# Patient Record
Sex: Female | Born: 1950 | Race: White | Hispanic: No | Marital: Married
Health system: Southern US, Community
[De-identification: ages and names within clinical notes are randomized; demographics above are authoritative.]

---

## 1998-03-12 ENCOUNTER — Other Ambulatory Visit: Admission: RE | Admit: 1998-03-12 | Discharge: 1998-03-12 | Payer: Self-pay | Admitting: Obstetrics and Gynecology

## 1999-04-02 ENCOUNTER — Other Ambulatory Visit: Admission: RE | Admit: 1999-04-02 | Discharge: 1999-04-02 | Payer: Self-pay | Admitting: Obstetrics and Gynecology

## 1999-04-07 ENCOUNTER — Encounter: Admission: RE | Admit: 1999-04-07 | Discharge: 1999-04-07 | Payer: Self-pay | Admitting: *Deleted

## 2000-06-19 ENCOUNTER — Ambulatory Visit (HOSPITAL_COMMUNITY): Admission: RE | Admit: 2000-06-19 | Discharge: 2000-06-19 | Payer: Self-pay | Admitting: Internal Medicine

## 2000-06-19 ENCOUNTER — Encounter: Payer: Self-pay | Admitting: Internal Medicine

## 2000-12-17 ENCOUNTER — Other Ambulatory Visit: Admission: RE | Admit: 2000-12-17 | Discharge: 2000-12-17 | Payer: Self-pay | Admitting: Obstetrics and Gynecology

## 2000-12-20 ENCOUNTER — Encounter: Admission: RE | Admit: 2000-12-20 | Discharge: 2000-12-20 | Payer: Self-pay | Admitting: Obstetrics and Gynecology

## 2000-12-20 ENCOUNTER — Encounter: Payer: Self-pay | Admitting: Obstetrics and Gynecology

## 2002-01-31 ENCOUNTER — Encounter: Admission: RE | Admit: 2002-01-31 | Discharge: 2002-01-31 | Payer: Self-pay | Admitting: Obstetrics and Gynecology

## 2002-01-31 ENCOUNTER — Encounter: Payer: Self-pay | Admitting: Obstetrics and Gynecology

## 2002-03-27 ENCOUNTER — Other Ambulatory Visit: Admission: RE | Admit: 2002-03-27 | Discharge: 2002-03-27 | Payer: Self-pay | Admitting: Obstetrics and Gynecology

## 2003-03-14 ENCOUNTER — Encounter: Admission: RE | Admit: 2003-03-14 | Discharge: 2003-03-14 | Payer: Self-pay | Admitting: Obstetrics and Gynecology

## 2003-03-29 ENCOUNTER — Other Ambulatory Visit: Admission: RE | Admit: 2003-03-29 | Discharge: 2003-03-29 | Payer: Self-pay | Admitting: Obstetrics and Gynecology

## 2004-03-18 ENCOUNTER — Encounter: Admission: RE | Admit: 2004-03-18 | Discharge: 2004-03-18 | Payer: Self-pay | Admitting: Obstetrics and Gynecology

## 2005-03-20 ENCOUNTER — Encounter: Admission: RE | Admit: 2005-03-20 | Discharge: 2005-03-20 | Payer: Self-pay | Admitting: Internal Medicine

## 2006-03-24 ENCOUNTER — Encounter: Admission: RE | Admit: 2006-03-24 | Discharge: 2006-03-24 | Payer: Self-pay | Admitting: Obstetrics and Gynecology

## 2007-04-12 ENCOUNTER — Encounter: Admission: RE | Admit: 2007-04-12 | Discharge: 2007-04-12 | Payer: Self-pay | Admitting: Obstetrics and Gynecology

## 2008-04-12 ENCOUNTER — Encounter: Admission: RE | Admit: 2008-04-12 | Discharge: 2008-04-12 | Payer: Self-pay | Admitting: Internal Medicine

## 2009-04-15 ENCOUNTER — Encounter: Admission: RE | Admit: 2009-04-15 | Discharge: 2009-04-15 | Payer: Self-pay | Admitting: Obstetrics and Gynecology

## 2009-09-24 ENCOUNTER — Encounter: Admission: RE | Admit: 2009-09-24 | Discharge: 2009-09-24 | Payer: Self-pay | Admitting: Obstetrics and Gynecology

## 2010-03-20 ENCOUNTER — Other Ambulatory Visit: Payer: Self-pay | Admitting: Internal Medicine

## 2010-03-20 DIAGNOSIS — Z1231 Encounter for screening mammogram for malignant neoplasm of breast: Secondary | ICD-10-CM

## 2010-04-22 ENCOUNTER — Ambulatory Visit
Admission: RE | Admit: 2010-04-22 | Discharge: 2010-04-22 | Disposition: A | Discharge status: Home / Self Care | Payer: 59 | Source: Ambulatory Visit | Attending: Internal Medicine | Admitting: Internal Medicine

## 2010-04-22 DIAGNOSIS — Z1231 Encounter for screening mammogram for malignant neoplasm of breast: Secondary | ICD-10-CM

## 2011-03-17 ENCOUNTER — Other Ambulatory Visit: Payer: Self-pay | Admitting: Internal Medicine

## 2011-03-17 DIAGNOSIS — Z1231 Encounter for screening mammogram for malignant neoplasm of breast: Secondary | ICD-10-CM

## 2011-04-24 ENCOUNTER — Ambulatory Visit
Admission: RE | Admit: 2011-04-24 | Discharge: 2011-04-24 | Disposition: A | Discharge status: Home / Self Care | Payer: 59 | Source: Ambulatory Visit | Attending: Internal Medicine | Admitting: Internal Medicine

## 2011-04-24 DIAGNOSIS — Z1231 Encounter for screening mammogram for malignant neoplasm of breast: Secondary | ICD-10-CM

## 2012-04-15 ENCOUNTER — Other Ambulatory Visit: Payer: Self-pay

## 2012-04-15 DIAGNOSIS — Z1231 Encounter for screening mammogram for malignant neoplasm of breast: Secondary | ICD-10-CM

## 2012-04-29 ENCOUNTER — Ambulatory Visit
Admission: RE | Admit: 2012-04-29 | Discharge: 2012-04-29 | Disposition: A | Discharge status: Home / Self Care | Payer: 59 | Source: Ambulatory Visit

## 2012-04-29 DIAGNOSIS — Z1231 Encounter for screening mammogram for malignant neoplasm of breast: Secondary | ICD-10-CM

## 2012-10-25 ENCOUNTER — Other Ambulatory Visit: Payer: Self-pay | Admitting: Obstetrics and Gynecology

## 2012-10-25 DIAGNOSIS — M8580 Other specified disorders of bone density and structure, unspecified site: Secondary | ICD-10-CM

## 2012-10-31 ENCOUNTER — Ambulatory Visit
Admission: RE | Admit: 2012-10-31 | Discharge: 2012-10-31 | Disposition: A | Discharge status: Home / Self Care | Payer: 59 | Source: Ambulatory Visit | Attending: Obstetrics and Gynecology | Admitting: Obstetrics and Gynecology

## 2012-10-31 DIAGNOSIS — M8580 Other specified disorders of bone density and structure, unspecified site: Secondary | ICD-10-CM

## 2013-04-25 ENCOUNTER — Other Ambulatory Visit: Payer: Self-pay

## 2013-04-25 DIAGNOSIS — Z1231 Encounter for screening mammogram for malignant neoplasm of breast: Secondary | ICD-10-CM

## 2013-05-17 ENCOUNTER — Ambulatory Visit
Admission: RE | Admit: 2013-05-17 | Discharge: 2013-05-17 | Disposition: A | Discharge status: Home / Self Care | Payer: 59 | Source: Ambulatory Visit

## 2013-05-17 DIAGNOSIS — Z1231 Encounter for screening mammogram for malignant neoplasm of breast: Secondary | ICD-10-CM

## 2014-02-06 DIAGNOSIS — H6503 Acute serous otitis media, bilateral: Secondary | ICD-10-CM | POA: Diagnosis not present

## 2014-02-06 DIAGNOSIS — J209 Acute bronchitis, unspecified: Secondary | ICD-10-CM | POA: Diagnosis not present

## 2014-02-19 DIAGNOSIS — E780 Pure hypercholesterolemia: Secondary | ICD-10-CM | POA: Diagnosis not present

## 2014-02-19 DIAGNOSIS — R1032 Left lower quadrant pain: Secondary | ICD-10-CM | POA: Diagnosis not present

## 2014-02-19 DIAGNOSIS — H9113 Presbycusis, bilateral: Secondary | ICD-10-CM | POA: Diagnosis not present

## 2014-02-19 DIAGNOSIS — I70219 Atherosclerosis of native arteries of extremities with intermittent claudication, unspecified extremity: Secondary | ICD-10-CM | POA: Diagnosis not present

## 2014-02-19 DIAGNOSIS — Z79899 Other long term (current) drug therapy: Secondary | ICD-10-CM | POA: Diagnosis not present

## 2014-02-19 DIAGNOSIS — R5383 Other fatigue: Secondary | ICD-10-CM | POA: Diagnosis not present

## 2014-02-19 DIAGNOSIS — Z131 Encounter for screening for diabetes mellitus: Secondary | ICD-10-CM | POA: Diagnosis not present

## 2014-02-19 DIAGNOSIS — Z0000 Encounter for general adult medical examination without abnormal findings: Secondary | ICD-10-CM | POA: Diagnosis not present

## 2014-02-19 DIAGNOSIS — Z1389 Encounter for screening for other disorder: Secondary | ICD-10-CM | POA: Diagnosis not present

## 2014-02-19 DIAGNOSIS — R42 Dizziness and giddiness: Secondary | ICD-10-CM | POA: Diagnosis not present

## 2014-02-19 DIAGNOSIS — H8309 Labyrinthitis, unspecified ear: Secondary | ICD-10-CM | POA: Diagnosis not present

## 2014-04-24 ENCOUNTER — Other Ambulatory Visit: Payer: Self-pay

## 2014-04-24 DIAGNOSIS — Z1231 Encounter for screening mammogram for malignant neoplasm of breast: Secondary | ICD-10-CM

## 2014-05-21 ENCOUNTER — Ambulatory Visit
Admission: RE | Admit: 2014-05-21 | Discharge: 2014-05-21 | Disposition: A | Discharge status: Home / Self Care | Payer: Medicare Other | Source: Ambulatory Visit

## 2014-05-21 DIAGNOSIS — Z1231 Encounter for screening mammogram for malignant neoplasm of breast: Secondary | ICD-10-CM

## 2015-04-17 ENCOUNTER — Other Ambulatory Visit: Payer: Self-pay

## 2015-04-17 DIAGNOSIS — Z1231 Encounter for screening mammogram for malignant neoplasm of breast: Secondary | ICD-10-CM

## 2015-05-22 ENCOUNTER — Ambulatory Visit
Admission: RE | Admit: 2015-05-22 | Discharge: 2015-05-22 | Disposition: A | Discharge status: Home / Self Care | Payer: Medicare Other | Source: Ambulatory Visit

## 2015-05-22 DIAGNOSIS — Z1231 Encounter for screening mammogram for malignant neoplasm of breast: Secondary | ICD-10-CM

## 2015-10-11 ENCOUNTER — Other Ambulatory Visit: Payer: Self-pay | Admitting: Obstetrics and Gynecology

## 2015-10-11 DIAGNOSIS — M8580 Other specified disorders of bone density and structure, unspecified site: Secondary | ICD-10-CM

## 2015-10-18 ENCOUNTER — Ambulatory Visit
Admission: RE | Admit: 2015-10-18 | Discharge: 2015-10-18 | Disposition: A | Discharge status: Home / Self Care | Payer: Medicare Other | Source: Ambulatory Visit | Attending: Obstetrics and Gynecology | Admitting: Obstetrics and Gynecology

## 2015-10-18 DIAGNOSIS — M8580 Other specified disorders of bone density and structure, unspecified site: Secondary | ICD-10-CM

## 2016-05-05 ENCOUNTER — Other Ambulatory Visit: Payer: Self-pay | Admitting: Internal Medicine

## 2016-05-05 DIAGNOSIS — Z1231 Encounter for screening mammogram for malignant neoplasm of breast: Secondary | ICD-10-CM

## 2016-05-25 ENCOUNTER — Ambulatory Visit
Admission: RE | Admit: 2016-05-25 | Discharge: 2016-05-25 | Disposition: A | Discharge status: Home / Self Care | Payer: Medicare Other | Source: Ambulatory Visit | Attending: Internal Medicine | Admitting: Internal Medicine

## 2016-05-25 DIAGNOSIS — Z1231 Encounter for screening mammogram for malignant neoplasm of breast: Secondary | ICD-10-CM

## 2017-05-04 ENCOUNTER — Other Ambulatory Visit: Payer: Self-pay | Admitting: Internal Medicine

## 2017-05-04 DIAGNOSIS — Z1231 Encounter for screening mammogram for malignant neoplasm of breast: Secondary | ICD-10-CM

## 2017-05-26 ENCOUNTER — Ambulatory Visit
Admission: RE | Admit: 2017-05-26 | Discharge: 2017-05-26 | Disposition: A | Discharge status: Home / Self Care | Payer: Medicare Other | Source: Ambulatory Visit | Attending: Internal Medicine | Admitting: Internal Medicine

## 2017-05-26 DIAGNOSIS — Z1231 Encounter for screening mammogram for malignant neoplasm of breast: Secondary | ICD-10-CM

## 2018-02-16 ENCOUNTER — Other Ambulatory Visit: Payer: Self-pay | Admitting: Obstetrics and Gynecology

## 2018-02-16 DIAGNOSIS — M8580 Other specified disorders of bone density and structure, unspecified site: Secondary | ICD-10-CM

## 2018-02-16 DIAGNOSIS — Z1231 Encounter for screening mammogram for malignant neoplasm of breast: Secondary | ICD-10-CM

## 2018-05-31 ENCOUNTER — Ambulatory Visit: Payer: Self-pay

## 2018-05-31 ENCOUNTER — Other Ambulatory Visit: Payer: Self-pay

## 2018-07-25 ENCOUNTER — Ambulatory Visit
Admission: RE | Admit: 2018-07-25 | Discharge: 2018-07-25 | Disposition: A | Discharge status: Home / Self Care | Payer: Medicare Other | Source: Ambulatory Visit | Attending: Obstetrics and Gynecology | Admitting: Obstetrics and Gynecology

## 2018-07-25 ENCOUNTER — Other Ambulatory Visit: Payer: Self-pay

## 2018-07-25 DIAGNOSIS — Z1231 Encounter for screening mammogram for malignant neoplasm of breast: Secondary | ICD-10-CM

## 2018-07-25 DIAGNOSIS — M8580 Other specified disorders of bone density and structure, unspecified site: Secondary | ICD-10-CM

## 2019-01-24 ENCOUNTER — Emergency Department (HOSPITAL_COMMUNITY): Payer: Medicare Other

## 2019-01-24 ENCOUNTER — Emergency Department (HOSPITAL_COMMUNITY)
Admission: EM | Admit: 2019-01-24 | Discharge: 2019-01-24 | Disposition: A | Discharge status: Home / Self Care | Payer: Medicare Other | Attending: Emergency Medicine | Admitting: Emergency Medicine

## 2019-01-24 DIAGNOSIS — Y999 Unspecified external cause status: Secondary | ICD-10-CM | POA: Insufficient documentation

## 2019-01-24 DIAGNOSIS — S43014A Anterior dislocation of right humerus, initial encounter: Secondary | ICD-10-CM | POA: Diagnosis not present

## 2019-01-24 DIAGNOSIS — W010XXA Fall on same level from slipping, tripping and stumbling without subsequent striking against object, initial encounter: Secondary | ICD-10-CM | POA: Insufficient documentation

## 2019-01-24 DIAGNOSIS — S4991XA Unspecified injury of right shoulder and upper arm, initial encounter: Secondary | ICD-10-CM | POA: Diagnosis present

## 2019-01-24 DIAGNOSIS — Y9389 Activity, other specified: Secondary | ICD-10-CM | POA: Insufficient documentation

## 2019-01-24 DIAGNOSIS — Y929 Unspecified place or not applicable: Secondary | ICD-10-CM | POA: Diagnosis not present

## 2019-01-24 MED ORDER — KETOROLAC TROMETHAMINE 30 MG/ML IJ SOLN
30.0000 mg | Freq: Once | INTRAMUSCULAR | Status: AC
  Administered 2019-01-24: 30 mg via INTRAVENOUS
  Filled 2019-01-24: qty 1

## 2019-01-24 MED ORDER — HYDROMORPHONE HCL 1 MG/ML IJ SOLN
1.0000 mg | Freq: Once | INTRAMUSCULAR | Status: AC
  Administered 2019-01-24: 1 mg via INTRAVENOUS
  Filled 2019-01-24: qty 1

## 2019-01-24 MED ORDER — HYDROCODONE-ACETAMINOPHEN 5-325 MG PO TABS: 1.0000 | ORAL_TABLET | Freq: Four times a day (QID) | ORAL | 0 refills | Status: AC | PRN

## 2019-01-24 MED ORDER — PROPOFOL 10 MG/ML IV BOLUS
0.5000 mg/kg | Freq: Once | INTRAVENOUS | Status: AC
  Administered 2019-01-24: 50 mg via INTRAVENOUS
  Filled 2019-01-24: qty 20

## 2019-01-24 MED ORDER — HYDROCODONE-ACETAMINOPHEN 5-325 MG PO TABS
1.0000 | ORAL_TABLET | Freq: Once | ORAL | Status: AC
  Administered 2019-01-24: 1 via ORAL
  Filled 2019-01-24: qty 1

## 2019-01-24 MED ORDER — PROPOFOL 10 MG/ML IV BOLUS
0.5000 mg/kg | Freq: Once | INTRAVENOUS | Status: AC
  Administered 2019-01-24: 20 mg via INTRAVENOUS

## 2019-01-24 MED ORDER — NAPROXEN 375 MG PO TABS: 375.0000 mg | ORAL_TABLET | Freq: Two times a day (BID) | ORAL | 0 refills | Status: AC

## 2019-01-24 NOTE — ED Notes (Signed)
Pt to xray at this time.

## 2019-01-24 NOTE — ED Provider Notes (Signed)
[facility]   CSN: [id number] Arrival date & time: 01/24/19  1434     History No chief complaint on file.   Lacey Jenkins is a 68 y.o. female.  HPI Patient was trying to help her husband as he was falling.  She recognize that she could not completely catch him but she was holding onto him with her left arm and trying to break the fall with her right.  She ended up landing on her outstretched right arm which slid in a very unusual and painful position.  She denies she sustained any other injury.  She reports that shoulder on the right is severely painful and she cannot move it.  She reports that she landed on top of him and did not hit her head or sustain any other injuries.    No past medical history on file.  There are no problems to display for this patient.   No past surgical history on file.   OB History   No obstetric history on file.     No family history on file.  Social History   Tobacco Use  . Smoking status: Not on file  Substance Use Topics  . Alcohol use: Not on file  . Drug use: Not on file    Home Medications Prior to Admission medications   Not on File    Allergies    Patient has no known allergies.  Review of Systems   Review of Systems 10 Systems reviewed and are negative for acute change except as noted in the HPI.  Physical Exam Updated Vital Signs BP 137/87 (BP Location: Right Arm)   Pulse 72   Temp 97.6 F (36.4 C) (Oral)   Resp 17   Ht 5\' 6"  (1.676 m)   Wt 73.9 kg   SpO2 93%   BMI 26.31 kg/m   Physical Exam Constitutional:      Comments: Patient is alert and nontoxic.  Mental status clear.  She is clearly in pain.  No respiratory distress.  HENT:     Head: Normocephalic and atraumatic.     Mouth/Throat:     Mouth: Mucous membranes are moist.     Pharynx: Oropharynx is clear.  Eyes:     Extraocular Movements: Extraocular movements intact.  Neck:     Comments: No  C-spine tenderness to palpation Cardiovascular:     Rate and Rhythm: Normal rate and regular rhythm.  Pulmonary:     Effort: Pulmonary effort is normal.     Breath sounds: Normal breath sounds.  Abdominal:     General: There is no distension.     Palpations: Abdomen is soft.     Tenderness: There is no abdominal tenderness. There is no guarding.  Musculoskeletal:     Cervical back: Neck supple.     Comments: Right shoulder has appearance of subluxation with deformity present.  Radial pulse was 1+ prereduction and strong and 2+ postreduction.  Prereduction, patient had difficulty completely opening her hand.  She had paresthesias and numbness which she reported was of the whole hand.  Skin:    General: Skin is warm and dry.  Neurological:     General: No focal deficit present.     Mental Status: She is oriented to person, place, and time.     Coordination: Coordination normal.  Psychiatric:        Mood and Affect: Mood normal.     ED Results / Procedures / Treatments  Labs (all labs ordered are listed, but only abnormal results are displayed) Labs Reviewed - No data to display  EKG None  Radiology No results found.  Procedures .Sedation  Date/Time: 01/24/2019 5:05 PM Performed by: Arby Barrette, MD Authorized by: Arby Barrette, MD   Consent:    Consent obtained:  Verbal   Consent given by:  Patient   Risks discussed:  Allergic reaction, dysrhythmia, inadequate sedation, nausea, prolonged hypoxia resulting in organ damage, prolonged sedation necessitating reversal, respiratory compromise necessitating ventilatory assistance and intubation and vomiting   Alternatives discussed:  Analgesia without sedation, anxiolysis and regional anesthesia Universal protocol:    Procedure explained and questions answered to patient or proxy's satisfaction: yes     Relevant documents present and verified: yes     Test results available and properly labeled: yes     Imaging studies  available: yes     Required blood products, implants, devices, and special equipment available: yes     Site/side marked: yes     Immediately prior to procedure a time out was called: yes     Patient identity confirmation method:  Verbally with patient Indications:    Procedure necessitating sedation performed by:  Physician performing sedation Pre-sedation assessment:    Time since last food or drink:  12   ASA classification: class 1 - normal, healthy patient     Neck mobility: normal     Mouth opening:  3 or more finger widths   Thyromental distance:  4 finger widths   Mallampati score:  I - soft palate, uvula, fauces, pillars visible   Pre-sedation assessments completed and reviewed: airway patency, cardiovascular function, hydration status, mental status, nausea/vomiting, pain level, respiratory function and temperature   Immediate pre-procedure details:    Reassessment: Patient reassessed immediately prior to procedure     Reviewed: vital signs, relevant labs/tests and NPO status     Verified: bag valve mask available, emergency equipment available, intubation equipment available, IV patency confirmed, oxygen available and suction available   Procedure details (see MAR for exact dosages):    Preoxygenation:  Nasal cannula   Sedation:  Propofol   Intended level of sedation: deep   Intra-procedure monitoring:  Blood pressure monitoring, cardiac monitor, continuous pulse oximetry, frequent LOC assessments, frequent vital sign checks and continuous capnometry   Intra-procedure events: none     Total Provider sedation time (minutes):  20 Post-procedure details:    Post-sedation assessment completed:  01/24/2019 5:06 PM   Attendance: Constant attendance by certified staff until patient recovered     Recovery: Patient returned to pre-procedure baseline     Post-sedation assessments completed and reviewed: airway patency, cardiovascular function, hydration status, mental status,  nausea/vomiting, pain level, respiratory function and temperature     Patient is stable for discharge or admission: yes     Patient tolerance:  Tolerated well, no immediate complications .Ortho Injury Treatment  Date/Time: 01/24/2019 5:06 PM Performed by: Arby Barrette, MD Authorized by: Arby Barrette, MD   Consent:    Consent obtained:  Verbal   Consent given by:  Patient   Risks discussed:  Fracture, nerve damage, recurrent dislocation, irreducible dislocation, stiffness, restricted joint movement and vascular damageInjury location: shoulder Location details: right shoulder Injury type: dislocation Dislocation type: anterior Hill-Sachs deformity: no Chronicity: new Pre-procedure distal perfusion: diminished Pre-procedure neurological function: diminished Pre-procedure range of motion: reduced  Anesthesia: Local anesthesia used: no  Patient sedated: Yes. Refer to sedation procedure documentation for details of sedation. Manipulation performed: yes  Reduction method: traction and counter traction Reduction successful: yes X-ray confirmed reduction: yes Immobilization: sling Post-procedure neurovascular assessment: post-procedure neurovascularly intact Post-procedure distal perfusion: normal Post-procedure neurological function: normal Post-procedure range of motion: improved Patient tolerance: patient tolerated the procedure well with no immediate complications    (including critical care time)  Medications Ordered in ED Medications - No data to display  ED Course  I have reviewed the triage vital signs and the nursing notes.  Pertinent labs & imaging results that were available during my care of the patient were reviewed by me and considered in my medical decision making (see chart for details).    MDM Rules/Calculators/A&P                      Patient presents with isolated shoulder dislocation.  This occurred during a fall as outlined above.  This was reduced  under sedation.  Prereduction patient did have diminished radial pulse and report of numbness and paresthesia of her entire hand.  Postreduction, radial pulses 2+ and strong, brisk cap refill of the hand and now normal opening and closing of the fingers.  She does continue to have pain with range of motion at the shoulder.  X-ray shows the shoulder to be reduced.  She may have also significant soft tissue injury of the shoulder.  Patient is counseled that she must follow-up with orthopedics.  Will be placed in a shoulder immobilizer with naproxen and Vicodin for pain control. Final Clinical Impression(s) / ED Diagnoses Final diagnoses:  None    Rx / DC Orders ED Discharge Orders    None       Charlesetta Shanks, MD 01/24/19 1709

## 2019-01-24 NOTE — ED Notes (Signed)
Doctor at bedside.

## 2019-01-24 NOTE — ED Notes (Signed)
Nurse Navigator called family and updated them on pt status.

## 2019-01-24 NOTE — Progress Notes (Signed)
Responded to Pt room for conscious sedation stand by.

## 2019-01-24 NOTE — ED Triage Notes (Addendum)
Pt here via EMS with co right shoulder pain. Her husband was passing out and she went to catch him a possibly dislocated her rt shoulder. EMS gave her 268mcg of fent.   Numbness to rt hand and diminished radial pulse on rt side.

## 2019-01-24 NOTE — Discharge Instructions (Signed)
1.  Wear your shoulder immobilizer until you are seen by orthopedics.  You may remove it to shower.  Do not raise your arm above your head. 2.  Take naproxen twice daily for pain control.  You may also take Vicodin 1 to 2 tablets every 6 hours if needed for additional pain control. 3.  Apply ice to the shoulder that is well wrapped for the next 48 hours for about 20 minutes every 2 hours.

## 2019-02-23 ENCOUNTER — Ambulatory Visit: Payer: Medicare Other

## 2019-03-04 ENCOUNTER — Ambulatory Visit: Payer: Medicare Other | Attending: Internal Medicine

## 2019-03-04 DIAGNOSIS — Z23 Encounter for immunization: Secondary | ICD-10-CM | POA: Insufficient documentation

## 2019-03-04 NOTE — Progress Notes (Signed)
   Covid-19 Vaccination Clinic  Name:  Lacey Jenkins    MRN: [id number] DOB: 1950-12-29  03/04/2019  [name] was observed post Covid-19 immunization for 15 minutes without incidence. She was provided with Vaccine Information Sheet and instruction to access the V-Safe system.   [name] was instructed to call 911 with any severe reactions post vaccine: Marland Kitchen Difficulty breathing  . Swelling of your face and throat  . A fast heartbeat  . A bad rash all over your body  . Dizziness and weakness    Immunizations Administered    Name Date Dose VIS Date Route   Pfizer COVID-19 Vaccine 03/04/2019 10:13 AM 0.3 mL 01/06/2019 Intramuscular   Manufacturer: [employer], [employer]   Lot: [id number]   NDC: [address]-3

## 2019-03-29 ENCOUNTER — Ambulatory Visit: Payer: Medicare Other | Attending: Internal Medicine

## 2019-03-29 DIAGNOSIS — Z23 Encounter for immunization: Secondary | ICD-10-CM | POA: Insufficient documentation

## 2019-03-29 NOTE — Progress Notes (Signed)
   Covid-19 Vaccination Clinic  Name:  Lacey Jenkins    MRN: [id number] DOB: Jul 21, 1950  03/29/2019  [name] was observed post Covid-19 immunization for 15 minutes without incident. She was provided with Vaccine Information Sheet and instruction to access the V-Safe system.   [name] was instructed to call 911 with any severe reactions post vaccine: Marland Kitchen Difficulty breathing  . Swelling of face and throat  . A fast heartbeat  . A bad rash all over body  . Dizziness and weakness   Immunizations Administered    Name Date Dose VIS Date Route   Pfizer COVID-19 Vaccine 03/29/2019  8:35 AM 0.3 mL 01/06/2019 Intramuscular   Manufacturer: [employer], [employer]   Lot: [id number]   NDC: [address]-2

## 2019-06-28 ENCOUNTER — Other Ambulatory Visit: Payer: Self-pay | Admitting: Internal Medicine

## 2019-06-28 DIAGNOSIS — Z1231 Encounter for screening mammogram for malignant neoplasm of breast: Secondary | ICD-10-CM

## 2019-07-27 ENCOUNTER — Ambulatory Visit
Admission: RE | Admit: 2019-07-27 | Discharge: 2019-07-27 | Disposition: A | Discharge status: Home / Self Care | Payer: Medicare Other | Source: Ambulatory Visit | Attending: Internal Medicine | Admitting: Internal Medicine

## 2019-07-27 ENCOUNTER — Other Ambulatory Visit: Payer: Self-pay

## 2019-07-27 DIAGNOSIS — Z1231 Encounter for screening mammogram for malignant neoplasm of breast: Secondary | ICD-10-CM

## 2020-07-08 ENCOUNTER — Other Ambulatory Visit: Payer: Self-pay | Admitting: Internal Medicine

## 2020-07-08 DIAGNOSIS — Z1231 Encounter for screening mammogram for malignant neoplasm of breast: Secondary | ICD-10-CM

## 2020-08-29 ENCOUNTER — Inpatient Hospital Stay: Admission: RE | Admit: 2020-08-29 | Payer: Medicare Other | Source: Ambulatory Visit

## 2020-10-18 ENCOUNTER — Ambulatory Visit
Admission: RE | Admit: 2020-10-18 | Discharge: 2020-10-18 | Disposition: A | Discharge status: Home / Self Care | Payer: Medicare Other | Source: Ambulatory Visit | Attending: Internal Medicine | Admitting: Internal Medicine

## 2020-10-18 ENCOUNTER — Other Ambulatory Visit: Payer: Self-pay

## 2020-10-18 DIAGNOSIS — Z1231 Encounter for screening mammogram for malignant neoplasm of breast: Secondary | ICD-10-CM

## 2020-12-13 IMAGING — DX DG SHOULDER 2+V PORT*R*
1 series · 2 of 2 positions shown · non-contrast
Comparison: January 24, 2019

CLINICAL DATA: Post reduction radiographs

EXAM:
PORTABLE RIGHT SHOULDER

[Series 1: shoulder · 0.14mm/px · 2 of 2 slices shown]
[im 1/2]
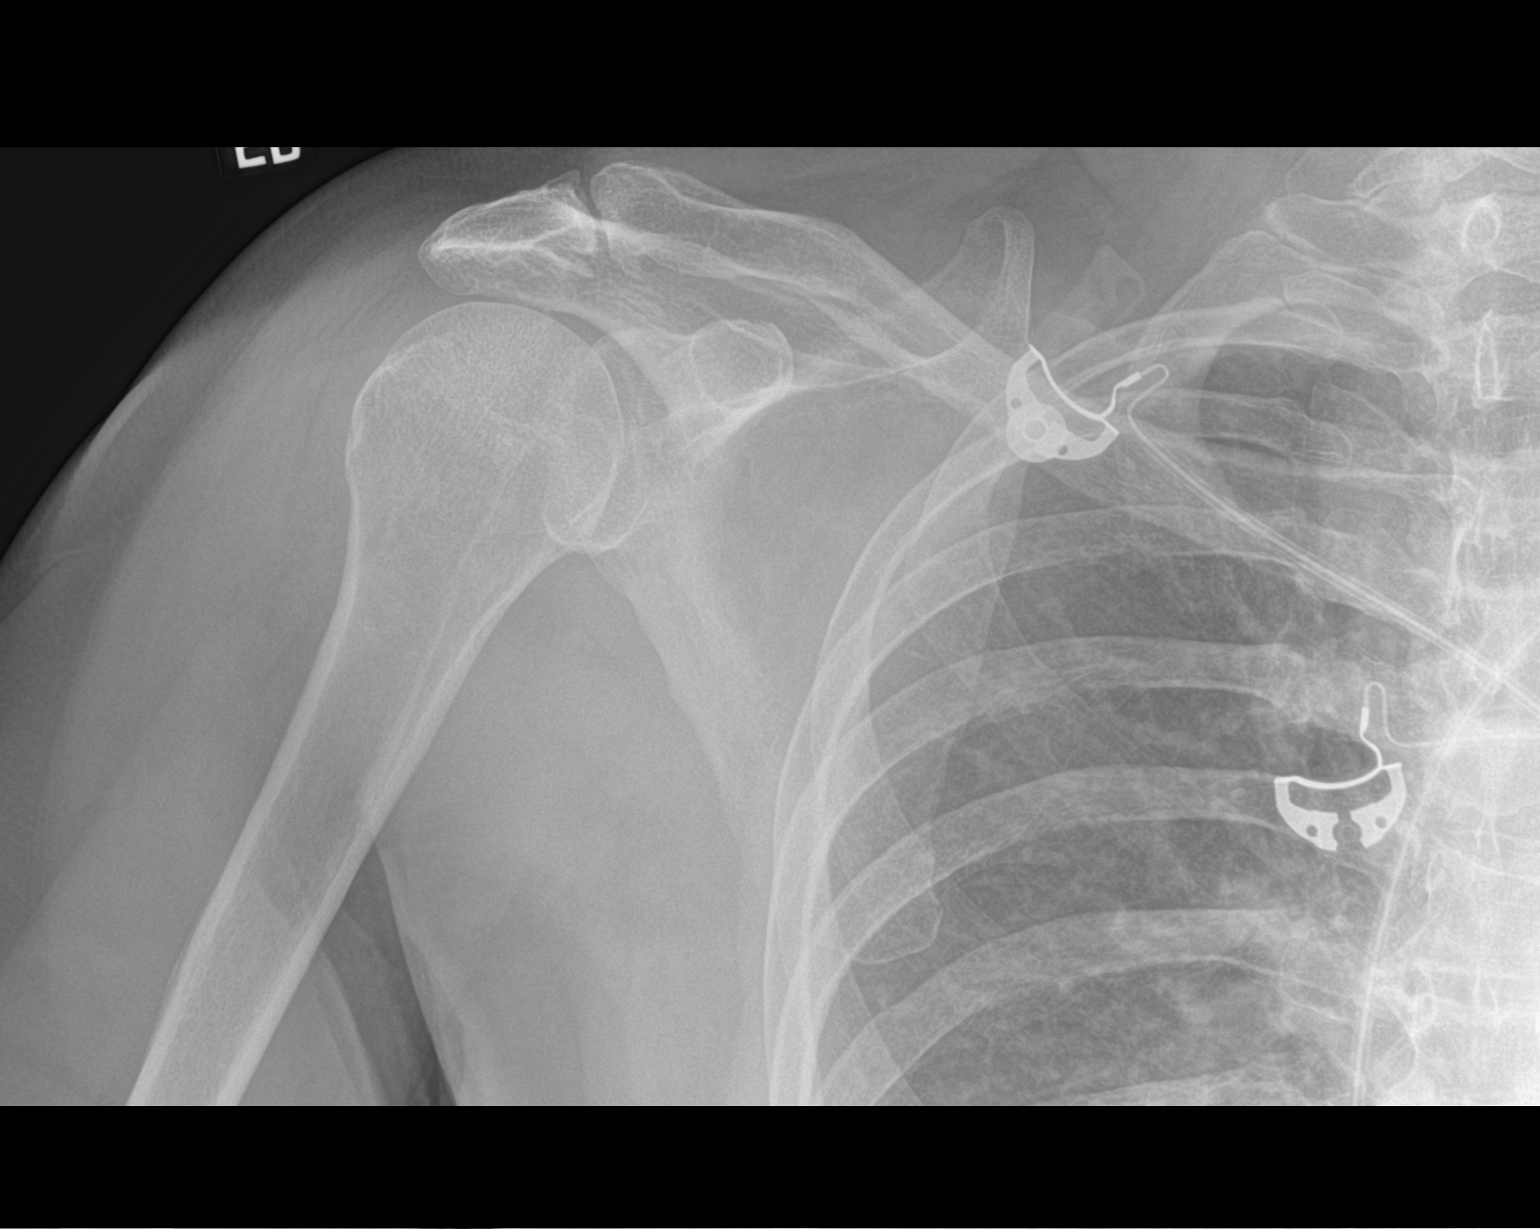
[im 2/2]
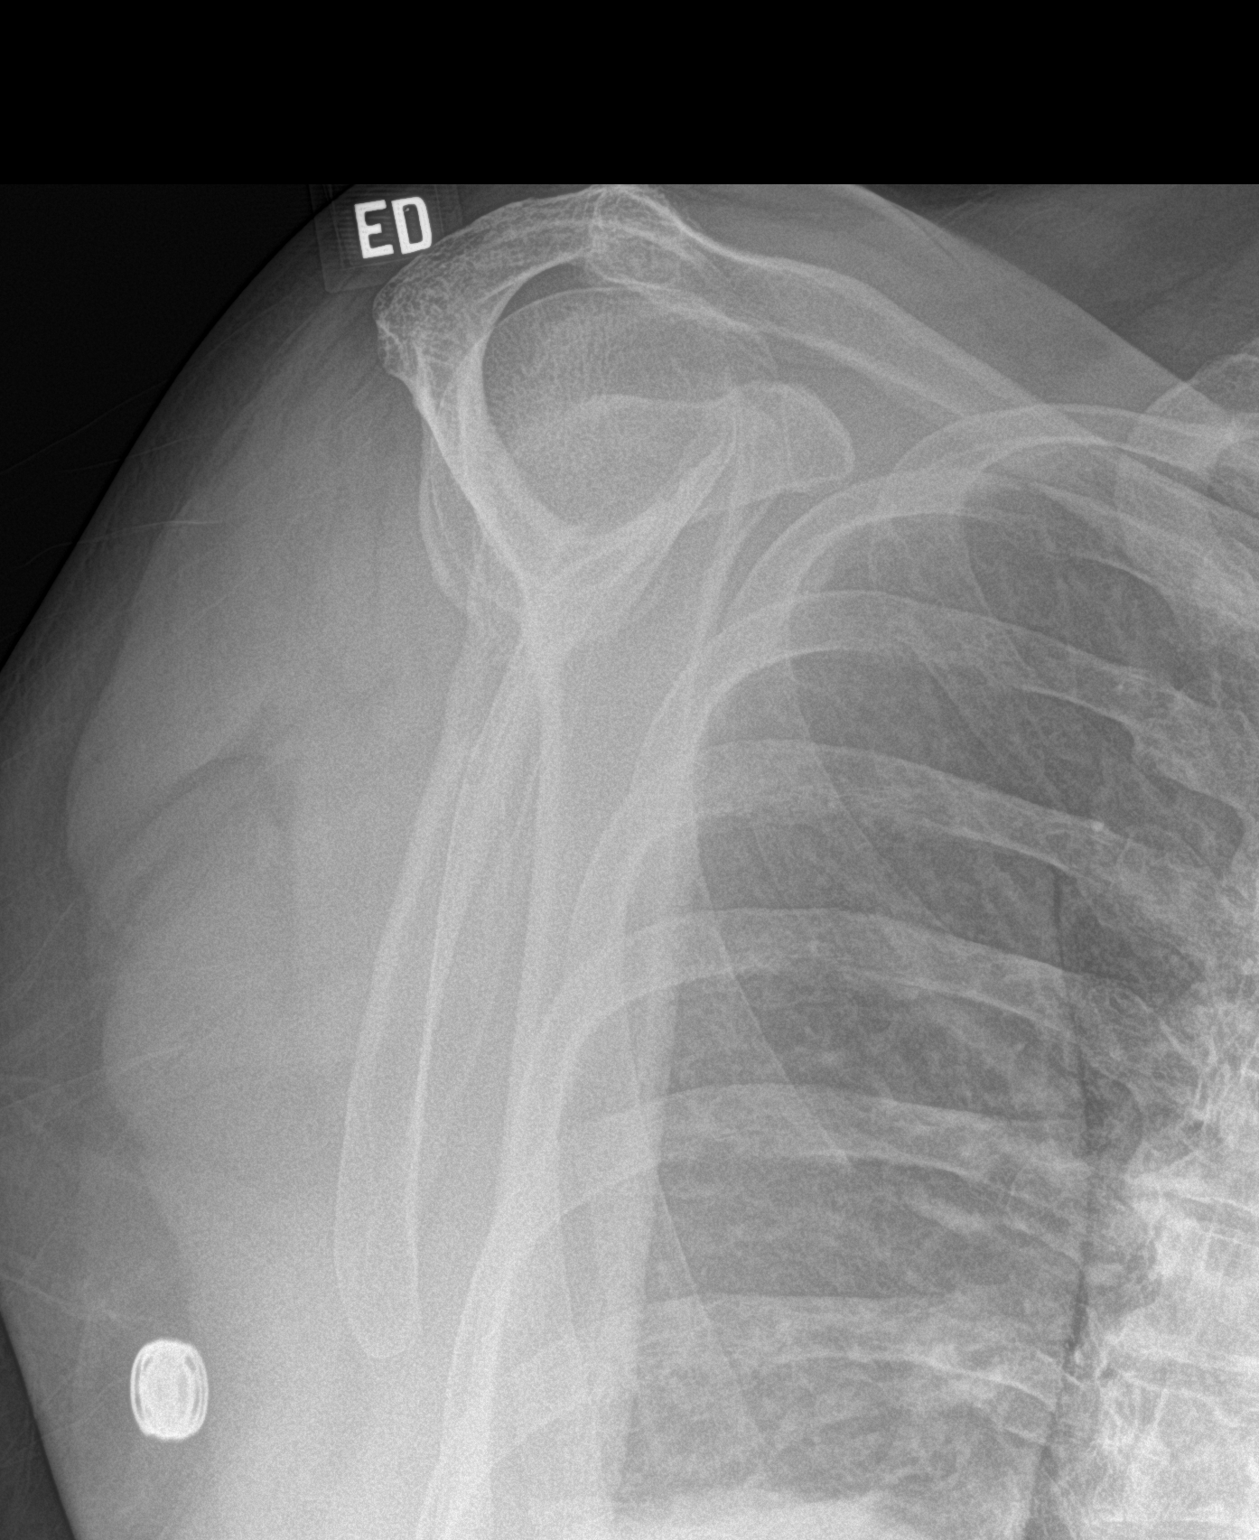

[2 of 2 positions shown; findings below may reference images not displayed]

FINDINGS: There has been interval reduction of the previously demonstrated
glenohumeral dislocation. There is no obvious displaced fracture.
IMPRESSION: Successful reduction of the previously demonstrated glenohumeral
dislocation. No obvious displaced fracture.

## 2020-12-13 IMAGING — CR DG SHOULDER 2+V*R*
3 series · 3 of 3 positions shown · non-contrast
Comparison: None.

CLINICAL DATA: Pain status post fall

EXAM:
RIGHT SHOULDER - 2+ VIEW

[shoulder grashey]
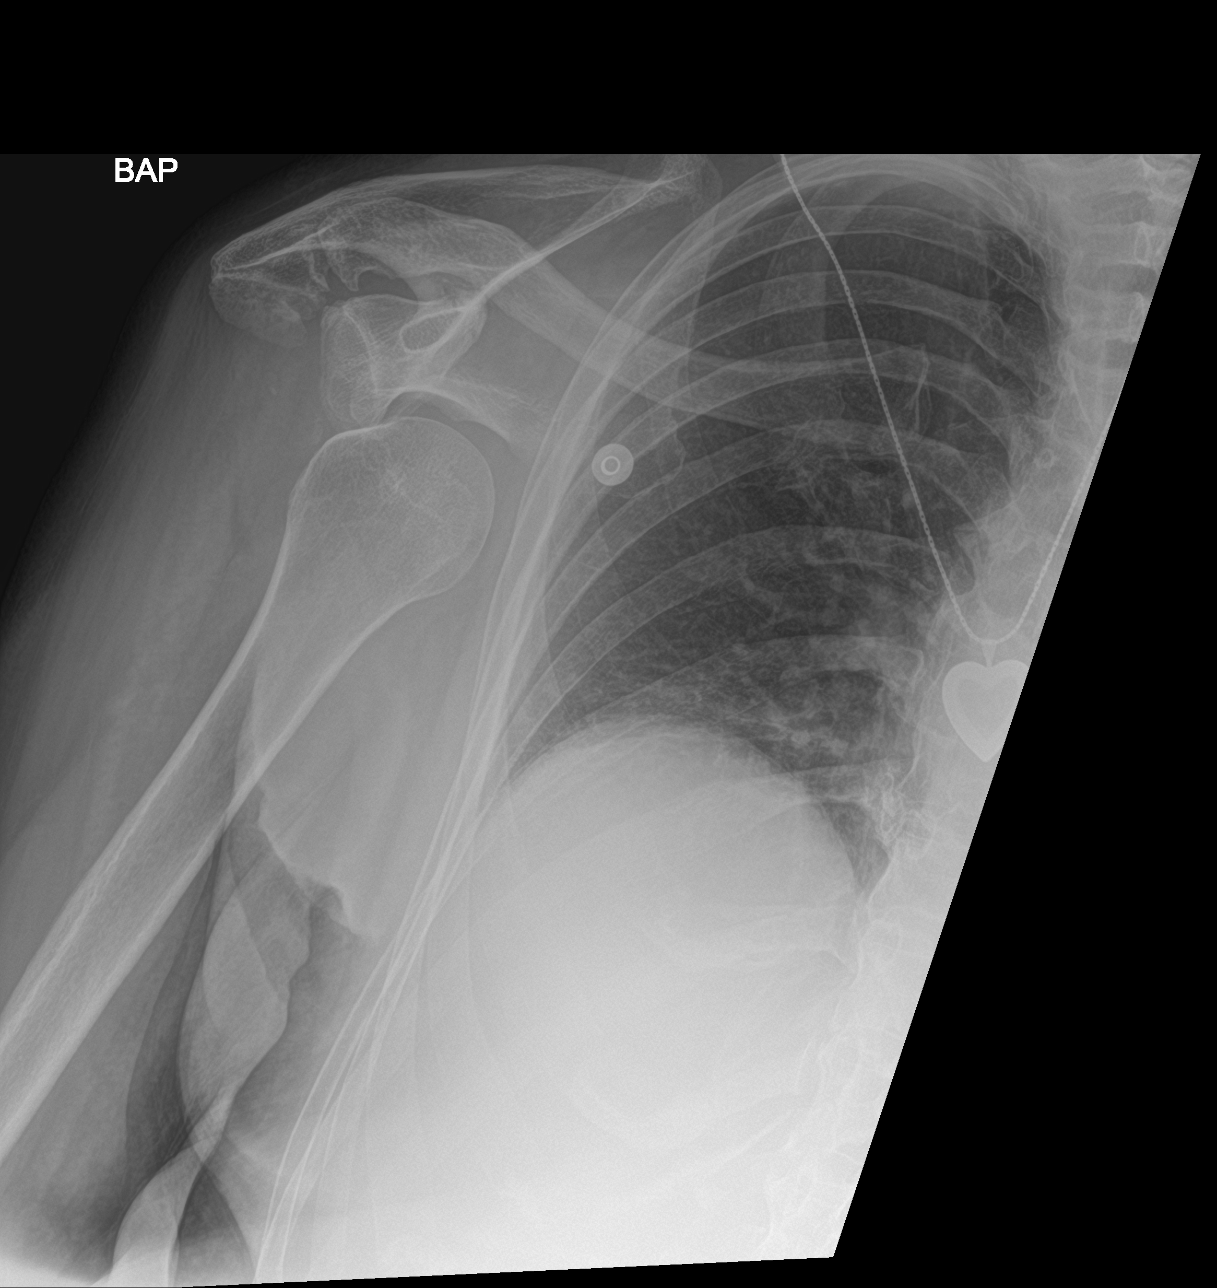

[shoulder y view]
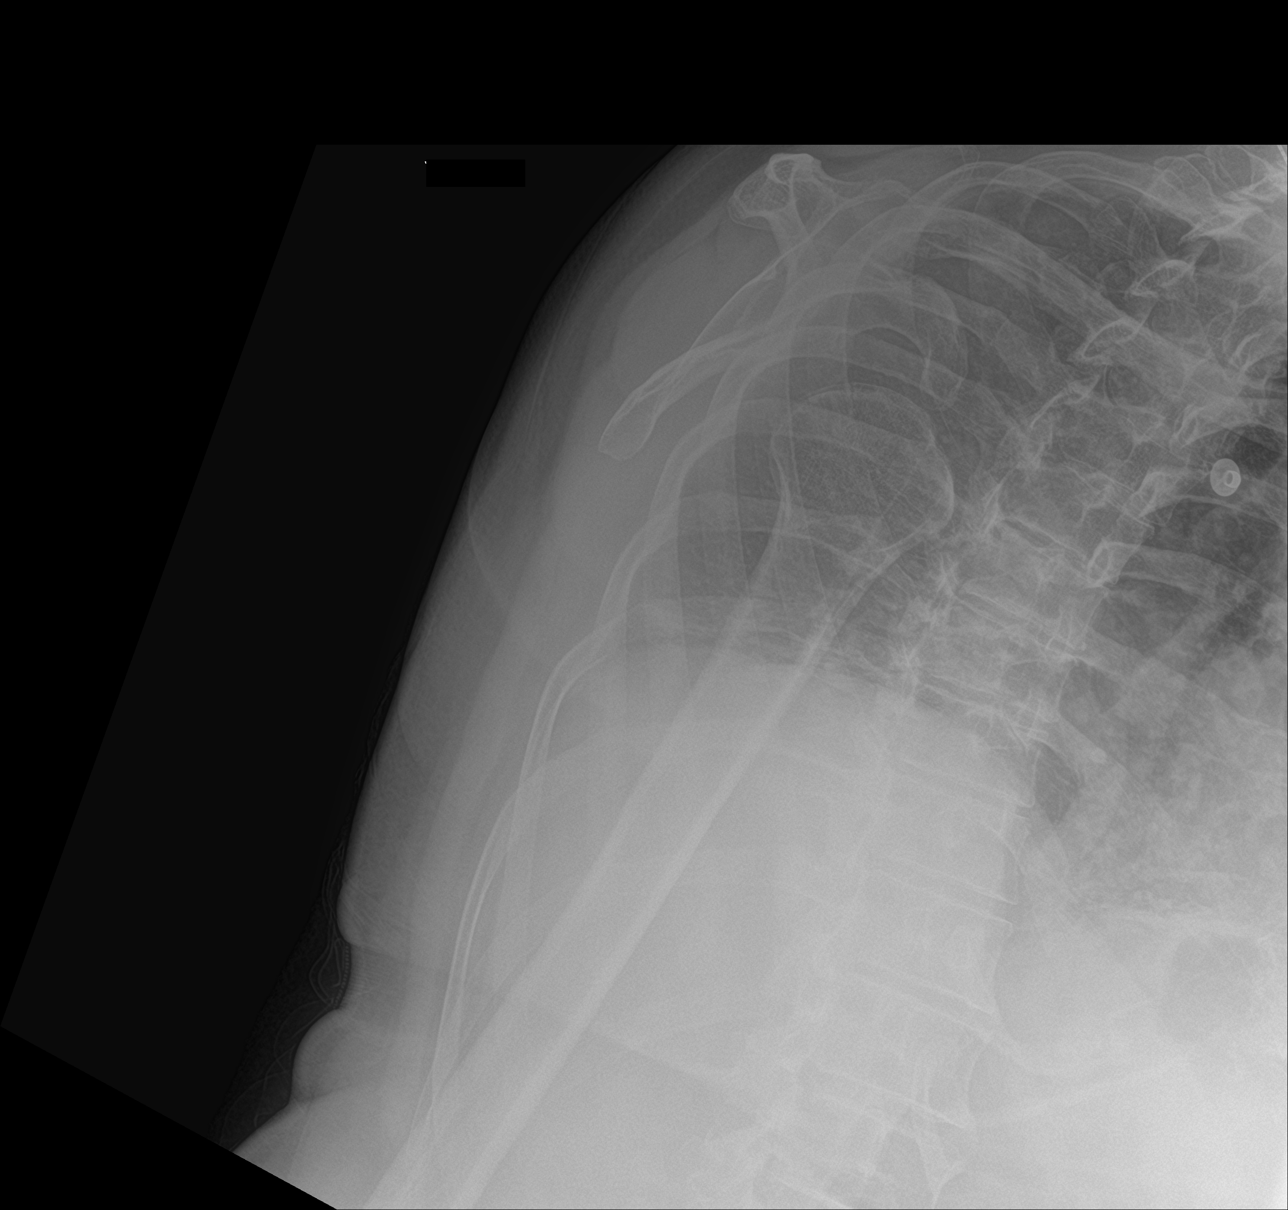

[shoulder ap neutral]
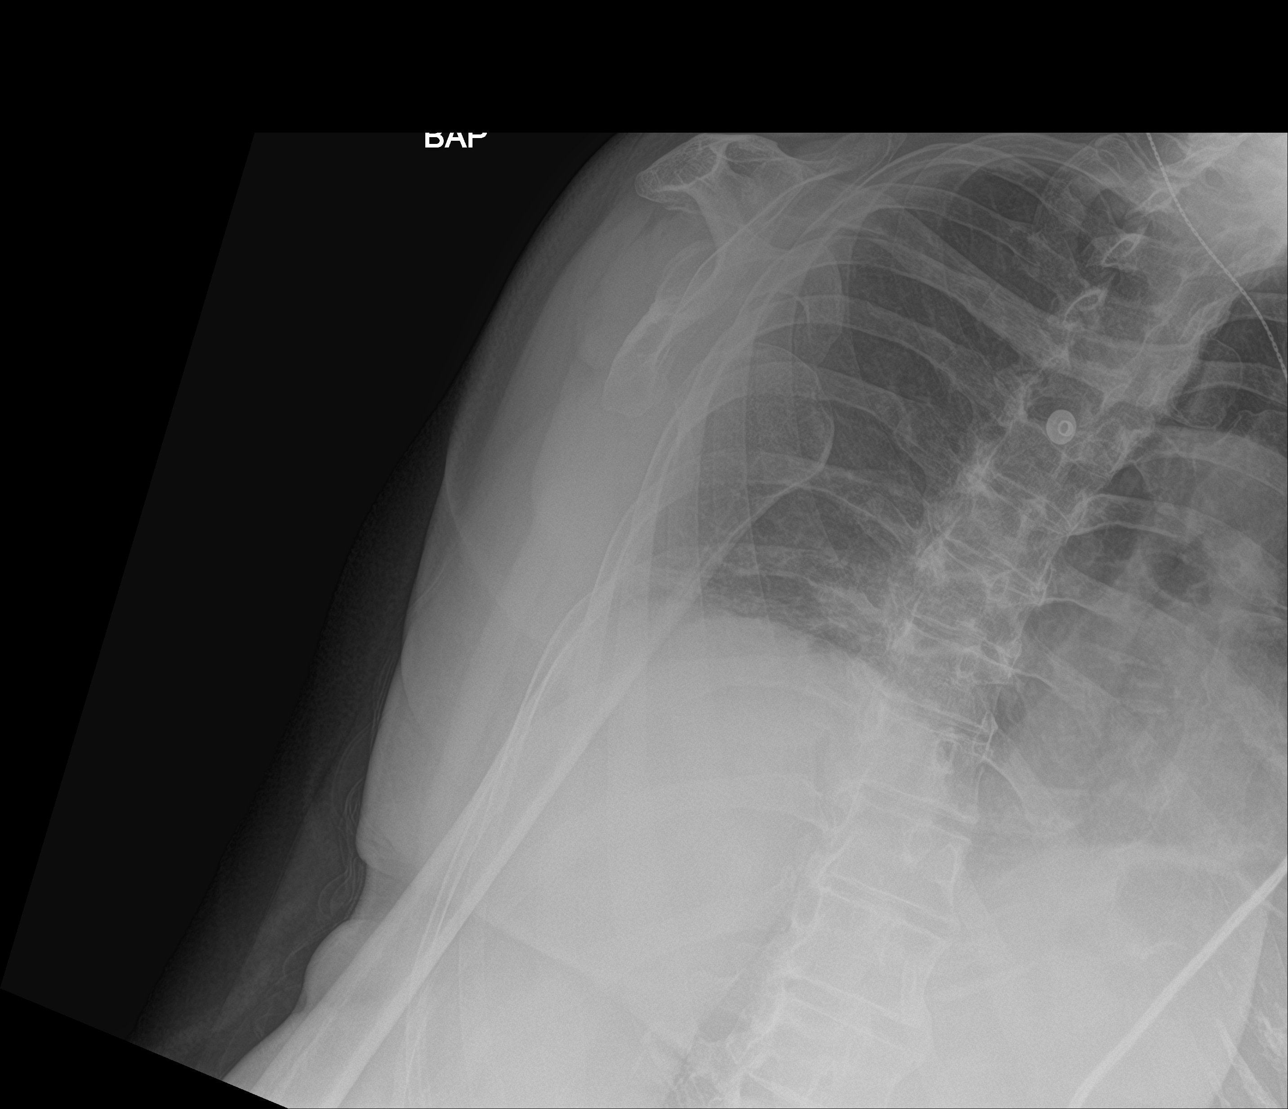

[3 of 3 positions shown; findings below may reference images not displayed]

FINDINGS: There is an anterior inferior glenohumeral dislocation. There is no
obvious displaced fracture. There are degenerative changes of the
right AC joint.
IMPRESSION: Anterior inferior glenohumeral dislocation without obvious displaced
fracture.

## 2021-11-06 ENCOUNTER — Other Ambulatory Visit: Payer: Self-pay | Admitting: Internal Medicine

## 2021-11-06 DIAGNOSIS — Z1231 Encounter for screening mammogram for malignant neoplasm of breast: Secondary | ICD-10-CM

## 2021-11-10 ENCOUNTER — Other Ambulatory Visit: Payer: Self-pay | Admitting: Obstetrics and Gynecology

## 2021-11-10 DIAGNOSIS — M8580 Other specified disorders of bone density and structure, unspecified site: Secondary | ICD-10-CM

## 2021-11-12 ENCOUNTER — Ambulatory Visit
Admission: RE | Admit: 2021-11-12 | Discharge: 2021-11-12 | Disposition: A | Discharge status: Home / Self Care | Payer: Medicare Other | Source: Ambulatory Visit | Attending: Internal Medicine | Admitting: Internal Medicine

## 2021-11-12 DIAGNOSIS — Z1231 Encounter for screening mammogram for malignant neoplasm of breast: Secondary | ICD-10-CM

## 2022-07-03 ENCOUNTER — Other Ambulatory Visit: Payer: Self-pay | Admitting: Obstetrics and Gynecology

## 2022-07-03 DIAGNOSIS — M8580 Other specified disorders of bone density and structure, unspecified site: Secondary | ICD-10-CM

## 2022-11-05 ENCOUNTER — Other Ambulatory Visit: Payer: Self-pay | Admitting: Internal Medicine

## 2022-11-05 DIAGNOSIS — Z1231 Encounter for screening mammogram for malignant neoplasm of breast: Secondary | ICD-10-CM

## 2022-11-17 ENCOUNTER — Ambulatory Visit: Payer: Medicare Other

## 2022-12-03 ENCOUNTER — Ambulatory Visit: Payer: Medicare Other

## 2023-01-18 ENCOUNTER — Ambulatory Visit
Admission: RE | Admit: 2023-01-18 | Discharge: 2023-01-18 | Disposition: A | Discharge status: Home / Self Care | Payer: Medicare Other | Source: Ambulatory Visit | Attending: Internal Medicine | Admitting: Internal Medicine

## 2023-01-18 ENCOUNTER — Ambulatory Visit
Admission: RE | Admit: 2023-01-18 | Discharge: 2023-01-18 | Disposition: A | Discharge status: Home / Self Care | Payer: Medicare Other | Source: Ambulatory Visit | Attending: Obstetrics and Gynecology | Admitting: Obstetrics and Gynecology

## 2023-01-18 DIAGNOSIS — Z1231 Encounter for screening mammogram for malignant neoplasm of breast: Secondary | ICD-10-CM

## 2023-01-18 DIAGNOSIS — M8580 Other specified disorders of bone density and structure, unspecified site: Secondary | ICD-10-CM

## 2024-02-10 ENCOUNTER — Other Ambulatory Visit: Payer: Self-pay | Admitting: Internal Medicine

## 2024-02-10 DIAGNOSIS — Z1231 Encounter for screening mammogram for malignant neoplasm of breast: Secondary | ICD-10-CM

## 2024-02-29 ENCOUNTER — Ambulatory Visit

## 2024-03-07 ENCOUNTER — Ambulatory Visit
